# Patient Record
Sex: Female | Born: 1990 | Race: White | Hispanic: No | Smoking: Current every day smoker
Health system: Southern US, Community
[De-identification: ages and names within clinical notes are randomized; demographics above are authoritative.]

## PROBLEM LIST (undated history)

## (undated) DIAGNOSIS — I471 Supraventricular tachycardia, unspecified: Secondary | ICD-10-CM

## (undated) DIAGNOSIS — K5010 Crohn's disease of large intestine without complications: Secondary | ICD-10-CM

## (undated) DIAGNOSIS — E7800 Pure hypercholesterolemia, unspecified: Secondary | ICD-10-CM

---

## 2021-03-23 ENCOUNTER — Encounter (HOSPITAL_BASED_OUTPATIENT_CLINIC_OR_DEPARTMENT_OTHER): Payer: Self-pay

## 2021-03-23 ENCOUNTER — Other Ambulatory Visit: Payer: Self-pay

## 2021-03-23 ENCOUNTER — Emergency Department (HOSPITAL_BASED_OUTPATIENT_CLINIC_OR_DEPARTMENT_OTHER)
Admission: EM | Admit: 2021-03-23 | Discharge: 2021-03-23 | Disposition: A | Discharge status: Home / Self Care | Payer: Medicaid Other | Attending: Emergency Medicine | Admitting: Emergency Medicine

## 2021-03-23 ENCOUNTER — Emergency Department (HOSPITAL_BASED_OUTPATIENT_CLINIC_OR_DEPARTMENT_OTHER): Payer: Medicaid Other

## 2021-03-23 DIAGNOSIS — N9489 Other specified conditions associated with female genital organs and menstrual cycle: Secondary | ICD-10-CM | POA: Diagnosis not present

## 2021-03-23 DIAGNOSIS — O418X10 Other specified disorders of amniotic fluid and membranes, first trimester, not applicable or unspecified: Secondary | ICD-10-CM | POA: Diagnosis not present

## 2021-03-23 DIAGNOSIS — O468X1 Other antepartum hemorrhage, first trimester: Secondary | ICD-10-CM

## 2021-03-23 DIAGNOSIS — Z3A10 10 weeks gestation of pregnancy: Secondary | ICD-10-CM | POA: Insufficient documentation

## 2021-03-23 DIAGNOSIS — O209 Hemorrhage in early pregnancy, unspecified: Secondary | ICD-10-CM | POA: Diagnosis present

## 2021-03-23 DIAGNOSIS — N939 Abnormal uterine and vaginal bleeding, unspecified: Secondary | ICD-10-CM

## 2021-03-23 HISTORY — DX: Pure hypercholesterolemia, unspecified: E78.00

## 2021-03-23 HISTORY — DX: Supraventricular tachycardia, unspecified: I47.10

## 2021-03-23 HISTORY — DX: Supraventricular tachycardia: I47.1

## 2021-03-23 HISTORY — DX: Crohn's disease of large intestine without complications: K50.10

## 2021-03-23 LAB — URINALYSIS, MICROSCOPIC (REFLEX): RBC / HPF: >50 RBC/hpf (ref 0–5)

## 2021-03-23 LAB — CBC WITH DIFFERENTIAL/PLATELET
Abs Immature Granulocytes: 0.01 10*3/uL (ref 0.00–0.07)
Basophils Absolute: 0 10*3/uL (ref 0.0–0.1)
Basophils Relative: 1 %
Eosinophils Absolute: 0.2 10*3/uL (ref 0.0–0.5)
Eosinophils Relative: 3 %
HCT: 40.5 % (ref 36.0–46.0)
Hemoglobin: 14 g/dL (ref 12.0–15.0)
Immature Granulocytes: 0 %
Lymphocytes Relative: 35 %
Lymphs Abs: 2.2 10*3/uL (ref 0.7–4.0)
MCH: 31.4 pg (ref 26.0–34.0)
MCHC: 34.6 g/dL (ref 30.0–36.0)
MCV: 90.8 fL (ref 80.0–100.0)
Monocytes Absolute: 0.6 10*3/uL (ref 0.1–1.0)
Monocytes Relative: 9 %
Neutro Abs: 3.4 10*3/uL (ref 1.7–7.7)
Neutrophils Relative %: 52 %
Platelets: 193 10*3/uL (ref 150–400)
RBC: 4.46 MIL/uL (ref 3.87–5.11)
RDW: 13.2 % (ref 11.5–15.5)
WBC: 6.4 10*3/uL (ref 4.0–10.5)
nRBC: 0 % (ref 0.0–0.2)

## 2021-03-23 LAB — URINALYSIS, ROUTINE W REFLEX MICROSCOPIC
Bilirubin Urine: NEGATIVE
Glucose, UA: NEGATIVE mg/dL
Ketones, ur: NEGATIVE mg/dL
Leukocytes,Ua: NEGATIVE
Nitrite: NEGATIVE
Protein, ur: 100 mg/dL — AB
Specific Gravity, Urine: 1.01 (ref 1.005–1.030)
pH: 6.5 (ref 5.0–8.0)

## 2021-03-23 LAB — BASIC METABOLIC PANEL
Anion gap: 10 (ref 5–15)
BUN: <5 mg/dL — ABNORMAL LOW (ref 6–20)
CO2: 21 mmol/L — ABNORMAL LOW (ref 22–32)
Calcium: 8.9 mg/dL (ref 8.9–10.3)
Chloride: 105 mmol/L (ref 98–111)
Creatinine, Ser: 0.42 mg/dL — ABNORMAL LOW (ref 0.44–1.00)
GFR, Estimated: >60 mL/min (ref >60)
Glucose, Bld: 83 mg/dL (ref 70–99)
Potassium: 3.4 mmol/L — ABNORMAL LOW (ref 3.5–5.1)
Sodium: 136 mmol/L (ref 135–145)

## 2021-03-23 LAB — HCG, QUANTITATIVE, PREGNANCY: hCG, Beta Chain, Quant, S: 14803 m[IU]/mL — ABNORMAL HIGH (ref <5)

## 2021-03-23 LAB — ABO/RH: ABO/RH(D): A POS

## 2021-03-23 MED ORDER — ACETAMINOPHEN 500 MG PO TABS
1000.0000 mg | ORAL_TABLET | Freq: Four times a day (QID) | ORAL | Status: DC | PRN
  Administered 2021-03-23: 1000 mg via ORAL
  Filled 2021-03-23: qty 2

## 2021-03-23 NOTE — Discharge Instructions (Signed)
Return to ED with any new or worsening symptoms such as uncontrolled vaginal bleeding, going through more than 1 pad an hour, abdominal cramping Please follow-up with your OB/GYN on Tuesday as previously discussed Please avoid all sexual activity with your husband until you have discussed your new finding of subchorionic hemorrhaging with your OB/GYN

## 2021-03-23 NOTE — ED Provider Notes (Signed)
[facility] EMERGENCY DEPARTMENT Provider Note   CSN: [id number] Arrival date & time: 03/23/21  1414     History  Chief Complaint  Patient presents with   Vaginal Bleeding    Approximately [redacted] weeks pregnant   Abdominal Cramping    Emily Skinner is a 31 y.o. female G6, P5 who presents to the ED due to vaginal bleeding that began this morning.  The patient is reportedly [redacted] weeks pregnant and receiving prenatal care through [facility] in [place].  Patient states that she called the on-call nurse when she noticed the vaginal bleeding starting and the nurse advised her to report to the ED.  Patient states that for the duration of her pregnancy, she has had light spotting that she characterizes as light pink.  The patient states that her and her husband had intercourse this morning and afterwards she began having heavy vaginal bleeding.  The patient states that the sex was not particularly rough and they did not use any instrumentation.  Patient states that the blood is dark red and darker than the usual spotting she has experienced during the earlier course of her pregnancy.  The patient reports that she is going through 1 pad an hour.  Patient denies lightheadedness, dizziness, weakness, abdominal cramping.    Vaginal Bleeding Associated symptoms: no abdominal pain, no dizziness, no nausea and no vaginal discharge   Abdominal Cramping Pertinent negatives include no abdominal pain.      Home Medications Prior to Admission medications   Not on File      Allergies    Sulfa antibiotics    Review of Systems   Review of Systems  Gastrointestinal:  Negative for abdominal pain, diarrhea, nausea and vomiting.  Genitourinary:  Positive for vaginal bleeding. Negative for vaginal discharge and vaginal pain.  Neurological:  Negative for dizziness, weakness and light-headedness.  All other systems reviewed and are negative.  Physical Exam Updated Vital Signs BP 121/76     Pulse 83    Temp 98.4 F (36.9 C) (Oral)    Resp 16    Ht 5\' 7"  (1.702 m)    Wt 92.1 kg    LMP 01/14/2021    SpO2 98%    BMI 31.79 kg/m  Physical Exam Vitals and nursing note reviewed.  Constitutional:      General: She is not in acute distress.    Appearance: She is not ill-appearing or toxic-appearing.     Comments: Patient appears extremely anxious speaking in pressured tone  HENT:     Head: Normocephalic and atraumatic.     Nose: Nose normal.     Mouth/Throat:     Mouth: Mucous membranes are moist.  Eyes:     Extraocular Movements: Extraocular movements intact.     Pupils: Pupils are equal, round, and reactive to light.  Cardiovascular:     Rate and Rhythm: Normal rate and regular rhythm.  Pulmonary:     Effort: Pulmonary effort is normal.     Breath sounds: No wheezing.  Abdominal:     General: Abdomen is flat. There is no distension.     Palpations: Abdomen is soft.     Tenderness: There is no abdominal tenderness. There is no guarding.  Genitourinary:    General: Normal vulva.     Labia:        Right: No lesion or injury.        Left: No lesion or injury.      Vagina: No vaginal discharge.  Cervix: Cervical bleeding present. No erythema.     Adnexa: Right adnexa normal and left adnexa normal.       Right: No tenderness.         Left: No tenderness.       Comments: Closed cervical os.  Moderate amount of blood noted in the vaginal vault. Musculoskeletal:     Cervical back: Normal range of motion and neck supple. No rigidity or tenderness.  Skin:    General: Skin is warm and dry.     Capillary Refill: Capillary refill takes less than 2 seconds.  Neurological:     General: No focal deficit present.     Mental Status: She is alert and oriented to person, place, and time. Mental status is at baseline.    ED Results / Procedures / Treatments   Labs (all labs ordered are listed, but only abnormal results are displayed) Labs Reviewed  BASIC METABOLIC PANEL -  Abnormal; Notable for the following components:      Result Value   Potassium 3.4 (*)    CO2 21 (*)    BUN <5 (*)    Creatinine, Ser 0.42 (*)    All other components within normal limits  HCG, QUANTITATIVE, PREGNANCY - Abnormal; Notable for the following components:   hCG, Beta Chain, Quant, S 14,803 (*)    All other components within normal limits  CBC WITH DIFFERENTIAL/PLATELET  URINALYSIS, ROUTINE W REFLEX MICROSCOPIC  ABO/RH    EKG None  Radiology US OB Comp Less 14 Wks  Result Date: 03/23/2021 CLINICAL DATA:  Vaginal bleeding for 1 day. First trimester pregnancy. EXAM: OBSTETRIC <14 WK [place] AND TRANSVAGINAL OB US TECHNIQUE: Both transabdominal and transvaginal ultrasound examinations were performed for complete evaluation of the gestation as well as the maternal uterus, adnexal regions, and pelvic cul-de-sac. Transvaginal technique was performed to assess early pregnancy. COMPARISON:  None. FINDINGS: Intrauterine gestational sac: Present Yolk sac:  Absent Embryo:  Present Cardiac Activity: Present Heart Rate: 160 bpm CRL:  52 mm   11 w   6 d                  [place] EDC: 10/16/2021 Subchorionic hemorrhage:  Moderate, measuring 2.2 by 1.8 by 3.3 cm. Maternal uterus/adnexae: No free fluid. The ovaries were not well seen. IMPRESSION: 1. Single living intrauterine pregnancy measuring at 11 weeks, 6 days gestation. There is a moderate amount of subchorionic hemorrhage measuring 2.2 by 1.8 by 3.3 cm. 2. The maternal ovaries were not well seen. Electronically Signed   By: Gaylyn Rong M.D.   On: 03/23/2021 17:42   US OB Transvaginal  Result Date: 03/23/2021 CLINICAL DATA:  Vaginal bleeding for 1 day. First trimester pregnancy. EXAM: OBSTETRIC <14 WK [place] AND TRANSVAGINAL OB US TECHNIQUE: Both transabdominal and transvaginal ultrasound examinations were performed for complete evaluation of the gestation as well as the maternal uterus, adnexal regions, and pelvic cul-de-sac. Transvaginal technique  was performed to assess early pregnancy. COMPARISON:  None. FINDINGS: Intrauterine gestational sac: Present Yolk sac:  Absent Embryo:  Present Cardiac Activity: Present Heart Rate: 160 bpm CRL:  52 mm   11 w   6 d                  [place] EDC: 10/16/2021 Subchorionic hemorrhage:  Moderate, measuring 2.2 by 1.8 by 3.3 cm. Maternal uterus/adnexae: No free fluid. The ovaries were not well seen. IMPRESSION: 1. Single living intrauterine pregnancy measuring at 11 weeks, 6 days gestation.  There is a moderate amount of subchorionic hemorrhage measuring 2.2 by 1.8 by 3.3 cm. 2. The maternal ovaries were not well seen. Electronically Signed   By: Gaylyn RongWalter  Liebkemann M.D.   On: 03/23/2021 17:42    Procedures Pelvic exam  Date/Time: 03/23/2021 5:16 PM Performed by: Al DecantGroce, Jaiyden Laur F, PA-C Authorized by: Al DecantGroce, Mazell Aylesworth F, PA-C  Consent: Verbal consent obtained. Written consent obtained. Consent given by: patient Patient understanding: patient states understanding of the procedure being performed Patient consent: the patient's understanding of the procedure matches consent given Patient identity confirmed: verbally with patient and arm band Time out: Immediately prior to procedure a "time out" was called to verify the correct patient, procedure, equipment, support staff and site/side marked as required. Preparation: Patient was prepped and draped in the usual sterile fashion. Local anesthesia used: no  Anesthesia: Local anesthesia used: no  Sedation: Patient sedated: no  Patient tolerance: patient tolerated the procedure well with no immediate complications Comments: I had a more difficult time than usual visualizing the patient's cervix.  On visual inspection, there is a moderate amount of blood noted at the vaginal vault.  Cervical os appears closed.  This was confirmed during bimanual examination.  There is no adnexal tenderness, cervical motion tenderness.      Medications Ordered in  ED Medications  acetaminophen (TYLENOL) tablet 1,000 mg (1,000 mg Oral Given 03/23/21 1643)    ED Course/ Medical Decision Making/ A&P                           Medical Decision Making Amount and/or Complexity of Data Reviewed Labs: ordered. Radiology: ordered.  Risk OTC drugs.   31 year old female presents due to vaginal bleeding after having sexual intercourse this morning with her husband.  Patient is [redacted] weeks pregnant.  On examination, patient is afebrile, nontachycardic, not hypoxic, speaking full sentences.  Labs and imaging ordered and personally interpreted by me include: Quant hCG, BMP, CBC, ABO Rh, ultrasound OB transvaginal, ultrasound OB transabdominal.  Patient CBC result is in the normal limits.  There are no signs of low hemoglobin. Patient BMP results within normal limits Patient quantitative hCG resulted 14,803. Patient ABO Rh shows the patient is A positive OB transvaginal/transabdominal ultrasound shows single live intrauterine pregnancy measuring 11 weeks.  The estimated cardiac activity/BPM at this intrauterine pregnancy is 160 bpm.  There appears to be subchorionic hemorrhaging measured at 2.2 x 1.8 x 3.3 cm.  This is most likely cause of bleeding.  Subchorionic hemorrhaging is extremely common in 1st trimester, usually gone by third trimester.  Due to this finding, I do not see a need at this time to admit the patient review any further imaging.  I do not see need to admit this patient as her vital signs of been stable for the duration of her visit and her lab work is reassuring.  The patient states that she has good follow-up with her OB/GYN and is scheduled to have an ultrasound on Tuesday.  I have explained to the findings of this patient and she voices understanding and joy.  I have advised the patient to maintain her scheduled appointment with her OB/GYN for Tuesday.  I have provided the patient work with return precautions and she voices understanding.  All of  the patient's questions were answered to her satisfaction.  I have discussed this patient and her case with Dr. Wallace CullensGray who is in agreement with the plan.  This patient is stable  on discharge.    Final Clinical Impression(s) / ED Diagnoses Final diagnoses:  Subchorionic hemorrhage of placenta in first trimester, single or unspecified fetus    Rx / DC Orders ED Discharge Orders     None         Al Decant, PA-C 03/23/21 1814    Franne Forts, DO 03/26/21 1157

## 2021-03-23 NOTE — ED Triage Notes (Signed)
Pt reports she is about 10-[redacted] weeks pregnant. She reports vaginal bleeding and abdominal cramping that started today.

## 2021-03-23 NOTE — ED Notes (Addendum)
Pt's blood type A+ per [facility] labwork from 06/29/2018 in Care Everywhere. Provider made aware

## 2021-08-06 ENCOUNTER — Inpatient Hospital Stay (HOSPITAL_COMMUNITY)
Admission: AD | Admit: 2021-08-06 | Discharge: 2021-08-06 | Disposition: A | Discharge status: Home / Self Care | Payer: Medicaid Other | Attending: Obstetrics and Gynecology | Admitting: Obstetrics and Gynecology

## 2021-08-06 ENCOUNTER — Encounter (HOSPITAL_COMMUNITY): Payer: Self-pay | Admitting: Obstetrics and Gynecology

## 2021-08-06 DIAGNOSIS — K625 Hemorrhage of anus and rectum: Secondary | ICD-10-CM | POA: Diagnosis present

## 2021-08-06 DIAGNOSIS — O26893 Other specified pregnancy related conditions, third trimester: Secondary | ICD-10-CM | POA: Insufficient documentation

## 2021-08-06 DIAGNOSIS — Z3A31 31 weeks gestation of pregnancy: Secondary | ICD-10-CM | POA: Insufficient documentation

## 2021-08-06 NOTE — MAU Provider Note (Signed)
History     CSN: [id number]  Arrival date and time: 08/06/21 1640   Event Date/Time   First Provider Initiated Contact with Patient 08/06/21 1709      Chief Complaint  Patient presents with   Dizziness   Rectal Bleeding   Emily Skinner is a 30 y.o. [id number] at [redacted]w[redacted]d who presents today with what she believes is rectal bleeding. 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 She also reports that on 07/06/2021 her children were diagnosed with E.Coli so she was started on Augmentin since that is what her children's stool culture showed was effective. She never had symptoms that she could tell. She reports that she has constant diarrhea, so she was given the medication due to eating and drinking the same things as her kids as well as being in close contact with them. She did take all the medication as she was prescribed.   Patient here with rectal bleeding that started earlier today .She states that at times it has been very heavy and she has had a few small blood clots. She sees Dr. Baldo Ash in [facility] for her chron's disease. She has not been to see them since November or December 2022. She had a colonoscopy at the end of 2022. She stopped her medication for Chron's because she can't take it in her 3rd trimester. She also missed some doses due to a sinus infection and she couldn't get the medication while she was sick. They didn't have any plan for her to take anything during her 3rd trimester.   Patient denies any contractions, VB or  LOF. She reports normal fetal movement.   Rectal Bleeding    OB History     Gravida  7   Para  5   Term  5   Preterm      AB  1   Living  4      SAB  1   IAB      Ectopic      Multiple      Live Births              Past Medical History:  Diagnosis Date   Crohn's colitis (HCC)    High cholesterol    PSVT (paroxysmal supraventricular tachycardia) (HCC)       No family  history on file.  Social History   Tobacco Use   Smoking status: Every Day    Types: Cigarettes   Smokeless tobacco: Never  Substance Use Topics   Alcohol use: Not Currently   Drug use: Never    Allergies:  Allergies  Allergen Reactions   Sulfa Antibiotics Itching    Medications Prior to Admission  Medication Sig Dispense Refill Last Dose   aspirin EC 81 MG tablet Take 81 mg by mouth daily. Swallow whole.   08/06/2021   hydrocortisone (ANUSOL-HC) 2.5 % rectal cream Place 1 application. rectally 2 (two) times daily.   t-2   inFLIXimab in sodium chloride 0.9 % Inject into the vein.   03/05/2021   metoprolol tartrate (LOPRESSOR) 25 MG tablet Take 25 mg by mouth 2 (two) times daily.   08/06/2021   nystatin ointment (MYCOSTATIN) Apply 1 application. topically 2 (two) times daily.   08/05/2021   Prenatal Vit-Fe Fumarate-FA (PRENATAL MULTIVITAMIN) TABS tablet Take 1 tablet by mouth daily at 12 noon.   08/06/2021    Review of Systems  All other systems reviewed and are  negative. Physical Exam   Blood pressure 116/76, pulse 94, temperature 98.3 F (36.8 C), temperature source Oral, resp. rate 16, last menstrual period 01/14/2021, SpO2 98 %.  Physical Exam Constitutional:      Appearance: She is well-developed.  HENT:     Head: Normocephalic.  Eyes:     Pupils: Pupils are equal, round, and reactive to light.  Cardiovascular:     Rate and Rhythm: Normal rate and regular rhythm.     Heart sounds: Normal heart sounds.  Pulmonary:     Effort: Pulmonary effort is normal. No respiratory distress.     Breath sounds: Normal breath sounds.  Abdominal:     Palpations: Abdomen is soft.     Tenderness: There is no abdominal tenderness.  Genitourinary:    Vagina: No bleeding. Vaginal discharge: mucusy.    Comments: External: no lesion Vagina: small amount of white discharge No bleeding noted at all from the vagina or cervix on speculum exam.  Some skin tags/small hemorrhoids around the anus.       Musculoskeletal:        General: Normal range of motion.     Cervical back: Normal range of motion and neck supple.  Skin:    General: Skin is warm and dry.  Neurological:     Mental Status: She is alert and oriented to person, place, and time.  Psychiatric:        Mood and Affect: Mood normal.        Behavior: Behavior normal.     NST:  Baseline: 155 Variability: moderate Accels: 15x15 Decels: none Toco: none Reactive/Appropriate for GA   MAU Course  Procedures  MDM 5:53PM: DW Dr. Ilda Basset and reviewed HPI/physical exam. No blood in vagina or noted on pelvic exam so this appears to be rectal bleeding. Since everything is ok from an OB standpoint, patient is ok for DC home and should follow up with her GI ASAP.   Assessment and Plan   1. Bright red rectal bleeding   2. [redacted] weeks gestation of pregnancy    DC home in stable condition  Comfort measures reviewed  3rd Trimester precautions  Bleeding precautions PTL precautions  Fetal kick counts RX: none  Return to MAU as needed FU with OB as planned   Follow-up Information     Akdamar, Murvin Donning, MD Follow up.   Specialty: Specialist Why: Call them ASAP to discuss symptoms Contact information: [address] [id number] [place] DNP, [place]  08/06/21  6:32 PM

## 2021-08-06 NOTE — MAU Note (Signed)
...  Emily Skinner is a 31 y.o. at [redacted]w[redacted]d here in MAU reporting: Around 20 minutes ago she used the restroom and noted bright red blood in the toilet and noted a few blood clots. She reports she thinks it came from her bottom as she has severe Chrohns Disease and has chronic hemorrhoids. She reports she took a tissue and wiped into her vagina and did not see any blood on that tissue. She is also endorsing light headedness that has been occasional since waking up this morning. She reports she thinks it is because her two year old daughter would not sleep last night. She is also endorsing generalized abdominal pain that has been occurring for "years" and she is unsure of how to differentiate what is new pain for pregnancy verses Chrohn's. Denies LOF. +FM.   She reports her children were diagnosed with E. Coli a couple of weeks ago and her MD prescribed her an antibiotic for coverage.  She reports she has not had any treatment for her Chrohns disease since January.   Pain score: 10/10 generalized abdomen  FHT: 155 initial FHR Lab orders placed from triage: UA

## 2021-11-25 ENCOUNTER — Encounter (HOSPITAL_COMMUNITY): Payer: Self-pay | Admitting: Emergency Medicine

## 2021-11-25 ENCOUNTER — Other Ambulatory Visit: Payer: Self-pay

## 2021-11-25 ENCOUNTER — Emergency Department (HOSPITAL_COMMUNITY)
Admission: EM | Admit: 2021-11-25 | Discharge: 2021-11-26 | Discharge status: Against Medical Advice | Payer: Medicaid Other | Attending: Emergency Medicine | Admitting: Emergency Medicine

## 2021-11-25 DIAGNOSIS — Z5321 Procedure and treatment not carried out due to patient leaving prior to being seen by health care provider: Secondary | ICD-10-CM | POA: Diagnosis not present

## 2021-11-25 DIAGNOSIS — R0789 Other chest pain: Secondary | ICD-10-CM | POA: Diagnosis not present

## 2021-11-25 LAB — BASIC METABOLIC PANEL
Anion gap: 8 (ref 5–15)
BUN: 13 mg/dL (ref 6–20)
CO2: 23 mmol/L (ref 22–32)
Calcium: 9.5 mg/dL (ref 8.9–10.3)
Chloride: 108 mmol/L (ref 98–111)
Creatinine, Ser: 0.62 mg/dL (ref 0.44–1.00)
GFR, Estimated: >60 mL/min (ref >60)
Glucose, Bld: 110 mg/dL — ABNORMAL HIGH (ref 70–99)
Potassium: 3.4 mmol/L — ABNORMAL LOW (ref 3.5–5.1)
Sodium: 139 mmol/L (ref 135–145)

## 2021-11-25 LAB — CBC WITH DIFFERENTIAL/PLATELET
Abs Immature Granulocytes: 0.04 10*3/uL (ref 0.00–0.07)
Basophils Absolute: 0 10*3/uL (ref 0.0–0.1)
Basophils Relative: 0 %
Eosinophils Absolute: 0.3 10*3/uL (ref 0.0–0.5)
Eosinophils Relative: 3 %
HCT: 40.9 % (ref 36.0–46.0)
Hemoglobin: 13.4 g/dL (ref 12.0–15.0)
Immature Granulocytes: 0 %
Lymphocytes Relative: 35 %
Lymphs Abs: 3.4 10*3/uL (ref 0.7–4.0)
MCH: 30.7 pg (ref 26.0–34.0)
MCHC: 32.8 g/dL (ref 30.0–36.0)
MCV: 93.8 fL (ref 80.0–100.0)
Monocytes Absolute: 0.7 10*3/uL (ref 0.1–1.0)
Monocytes Relative: 7 %
Neutro Abs: 5.3 10*3/uL (ref 1.7–7.7)
Neutrophils Relative %: 55 %
Platelets: 277 10*3/uL (ref 150–400)
RBC: 4.36 MIL/uL (ref 3.87–5.11)
RDW: 13.2 % (ref 11.5–15.5)
WBC: 9.7 10*3/uL (ref 4.0–10.5)
nRBC: 0 % (ref 0.0–0.2)

## 2021-11-25 LAB — LIPASE, BLOOD: Lipase: 62 U/L — ABNORMAL HIGH (ref 11–51)

## 2021-11-25 LAB — I-STAT BETA HCG BLOOD, ED (MC, WL, AP ONLY): I-stat hCG, quantitative: <5 m[IU]/mL (ref <5)

## 2021-11-25 LAB — TROPONIN I (HIGH SENSITIVITY): Troponin I (High Sensitivity): 2 ng/L (ref <18)

## 2021-11-25 NOTE — ED Triage Notes (Addendum)
Pt c/o sharp left sided chest pain under left breast x 20 mins with radiation to left flank and left back; pt reports same "episodes" intermittent x 1 year but today pain has lasted longer; worse with inhalation and bending over; pt reports having a C-Section x 1 month ago

## 2021-11-25 NOTE — ED Provider Triage Note (Signed)
Emergency Medicine Provider Triage Evaluation Note  Emily Skinner , a 31 y.o. female  was evaluated in triage.  Pt complains of CP. No LE swelling or pain. CP constant over the last 20 min, does have intermittent CP at baseline. Has SVT. Denies palpitations. C-section 8 weeks ago. No hx of PE, DVT. Occasionally has ABD pain. Hx of crohn's not on meds. No bloody stool  Review of Systems  Positive: CP Negative:   Physical Exam  BP 126/83 (BP Location: Right Arm)   Pulse 74   Temp 97.9 F (36.6 C)   Resp 18   Ht 5\' 7"  (1.702 m)   Wt 81.6 kg   LMP 11/22/2021 (Exact Date)   SpO2 97%   Breastfeeding Unknown   BMI 28.19 kg/m  Gen:   Awake, no distress   Resp:  Normal effort  MSK:   Moves extremities without difficulty  Other:    Medical Decision Making  Medically screening exam initiated at 7:20 PM.  Appropriate orders placed.  Arin Vanosdol was informed that the remainder of the evaluation will be completed by another provider, this initial triage assessment does not replace that evaluation, and the importance of remaining in the ED until their evaluation is complete.  CP   Karter Hellmer A, PA-C 11/25/21 1922

## 2021-11-26 NOTE — ED Notes (Signed)
Pt left AMA °

## 2022-01-22 ENCOUNTER — Emergency Department (HOSPITAL_COMMUNITY)
Admission: EM | Admit: 2022-01-22 | Discharge: 2022-01-22 | Disposition: A | Discharge status: Home / Self Care | Payer: Medicaid Other | Attending: Emergency Medicine | Admitting: Emergency Medicine

## 2022-01-22 ENCOUNTER — Encounter (HOSPITAL_COMMUNITY): Payer: Self-pay | Admitting: Emergency Medicine

## 2022-01-22 ENCOUNTER — Other Ambulatory Visit: Payer: Self-pay

## 2022-01-22 DIAGNOSIS — H10211 Acute toxic conjunctivitis, right eye: Secondary | ICD-10-CM | POA: Insufficient documentation

## 2022-01-22 DIAGNOSIS — Z7982 Long term (current) use of aspirin: Secondary | ICD-10-CM | POA: Diagnosis not present

## 2022-01-22 DIAGNOSIS — H5789 Other specified disorders of eye and adnexa: Secondary | ICD-10-CM | POA: Diagnosis present

## 2022-01-22 DIAGNOSIS — T6591XA Toxic effect of unspecified substance, accidental (unintentional), initial encounter: Secondary | ICD-10-CM | POA: Diagnosis not present

## 2022-01-22 MED ORDER — TETRACAINE HCL 0.5 % OP SOLN
2.0000 [drp] | Freq: Once | OPHTHALMIC | Status: AC
  Administered 2022-01-22: 2 [drp] via OPHTHALMIC

## 2022-01-22 MED ORDER — IBUPROFEN 200 MG PO TABS
600.0000 mg | ORAL_TABLET | Freq: Once | ORAL | Status: AC
  Administered 2022-01-22: 600 mg via ORAL
  Filled 2022-01-22: qty 3

## 2022-01-22 MED ORDER — TETRACAINE HCL 0.5 % OP SOLN
2.0000 [drp] | Freq: Once | OPHTHALMIC | Status: AC
  Administered 2022-01-22: 2 [drp] via OPHTHALMIC
  Filled 2022-01-22: qty 4

## 2022-01-22 MED ORDER — FLUORESCEIN SODIUM 1 MG OP STRP
1.0000 | ORAL_STRIP | Freq: Once | OPHTHALMIC | Status: AC
  Administered 2022-01-22: 1 via OPHTHALMIC
  Filled 2022-01-22: qty 1

## 2022-01-22 NOTE — ED Provider Notes (Signed)
[facility]-EMERGENCY DEPT Provider Note   CSN: [id number] Arrival date & time: 01/22/22  1750     History  Chief Complaint  Patient presents with   Eye Injury    Emily Skinner is a 31 y.o. female.   Eye Injury  Patient presents after eye injury.  Had a dish pot exploded and dry this morning.  Has some swelling.  Has been using cold compresses.  Went to urgent care who later sent her in for treatment here.  Mild blurred vision.  Does have pain.  Reportedly had a "tear" in her eye.    Past Medical History:  Diagnosis Date   Crohn's colitis (HCC)    High cholesterol    PSVT (paroxysmal supraventricular tachycardia)     Home Medications Prior to Admission medications   Medication Sig Start Date End Date Taking? Authorizing Provider  aspirin EC 81 MG tablet Take 81 mg by mouth daily. Swallow whole.    [provider]  hydrocortisone (ANUSOL-HC) 2.5 % rectal cream Place 1 application. rectally 2 (two) times daily.    [provider]  inFLIXimab in sodium chloride 0.9 % Inject into the vein.    [provider]  metoprolol tartrate (LOPRESSOR) 25 MG tablet Take 25 mg by mouth 2 (two) times daily.    [provider]  nystatin ointment (MYCOSTATIN) Apply 1 application. topically 2 (two) times daily.    [provider]  Prenatal Vit-Fe Fumarate-FA (PRENATAL MULTIVITAMIN) TABS tablet Take 1 tablet by mouth daily at 12 noon.    [provider]      Allergies    Sulfa antibiotics    Review of Systems   Review of Systems  Physical Exam Updated Vital Signs BP 109/75 (BP Location: Left Arm)   Pulse (!) 58   Temp 98.1 F (36.7 C) (Oral)   Resp 18   Ht 5\' 7"  (1.702 m)   Wt 82 kg   SpO2 100%   BMI 28.31 kg/m  Physical Exam Vitals and nursing note reviewed.  Constitutional:      Appearance: Normal appearance.  HENT:     Head: Atraumatic.  Eyes:     Pupils: Pupils are equal, round, and reactive  to light.   Conjunctival injection on right.  Some edema around face.  Does not appear to be cellulitic.  No corneal uptake on slit-lamp exam.  Normal pH.  ED Results / Procedures / Treatments   Labs (all labs ordered are listed, but only abnormal results are displayed) Labs Reviewed - No data to display  EKG None  Radiology No results found.  Procedures Procedures    Medications Ordered in ED Medications  tetracaine (PONTOCAINE) 0.5 % ophthalmic solution 2 drop (2 drops Right Eye Given 01/22/22 1842)  ibuprofen (ADVIL) tablet 600 mg (600 mg Oral Given 01/22/22 1845)  tetracaine (PONTOCAINE) 0.5 % ophthalmic solution 2 drop (2 drops Right Eye Given by Other 01/22/22 2133)  fluorescein ophthalmic strip 1 strip (1 strip Right Eye Given by Other 01/22/22 2133)    ED Course/ Medical Decision Making/ A&P                           Medical Decision Making Risk Prescription drug management.   Patient with chemical injury to right eye.  Was a dishwasher pod.  Does have some irritation but no corneal uptake.  pH normal.  Irritated eye but also more irrigation.  With benign exam  I think can follow-up with ophthalmology as needed.  Already given erythromycin by urgent care.  Will discharge home.        Final Clinical Impression(s) / ED Diagnoses Final diagnoses:  Chemical conjunctivitis of right eye    Rx / DC Orders ED Discharge Orders     None         Benjiman Core, MD 01/22/22 2329

## 2022-01-22 NOTE — ED Notes (Signed)
R Eye irrigated with normal saline, pt tolerated well.

## 2022-01-22 NOTE — ED Notes (Signed)
Eye materials at bedside

## 2022-01-22 NOTE — Discharge Instructions (Signed)
Continue the since you are given by the urgent care.

## 2022-01-22 NOTE — ED Triage Notes (Signed)
Patient report eye injury this morning. Pt report she tried to separate two dish pods and pods burst and shoot all over on her right eye. Urgent care tried to irrigate her eye but urgent care recommended she need to come in ED  d/t eye drainage and small laceration on her eye. Pt c/o shooting, burning pain on her left eye.

## 2022-01-22 NOTE — ED Provider Triage Note (Signed)
Emergency Medicine Provider Triage Evaluation Note  Lindzie Boxx , a 31 y.o. female  was evaluated in triage.  Pt complains of eye injury. Patient was attempting to separate dish washing pods this morning, when pod "exploded", with product getting into right eye. She immediately irrigated with water for 15 minutes, followed by cool compresses. She continued to have pain and blurred vision, and was seen by urgent care. Patient reports provider saw a laceration to the eye, and patient was sent to the ED for additional care.  Review of Systems  Positive: Blurred vision, eye drainage, eye irritation,headache Negative: Nausea, vomiting  Physical Exam  BP (!) 159/84   Pulse 98   Temp 98 F (36.7 C) (Oral)   Resp 18   Ht 5\' 7"  (1.702 m)   Wt 82 kg   SpO2 100%   BMI 28.31 kg/m  Gen:   Awake, no distress   Resp:  Normal effort  MSK:   Moves extremities without difficulty  Other:  Edema of eyelid, conjunctival injection, with mucous discharge  Medical Decision Making  Medically screening exam initiated at 6:25 PM.  Appropriate orders placed.  Breniyah Romm was informed that the remainder of the evaluation will be completed by another provider, this initial triage assessment does not replace that evaluation, and the importance of remaining in the ED until their evaluation is complete.  Tetracaine applied to right eye and ibuprofen given in triage.   Regis Bill, NP 01/22/22 2203

## 2022-05-14 ENCOUNTER — Encounter (HOSPITAL_COMMUNITY): Payer: Self-pay | Admitting: Emergency Medicine

## 2022-05-14 ENCOUNTER — Other Ambulatory Visit: Payer: Self-pay

## 2022-05-14 ENCOUNTER — Emergency Department (HOSPITAL_COMMUNITY)
Admission: EM | Admit: 2022-05-14 | Discharge: 2022-05-15 | Disposition: A | Discharge status: Home / Self Care | Payer: Medicaid Other | Attending: Emergency Medicine | Admitting: Emergency Medicine

## 2022-05-14 DIAGNOSIS — Z7982 Long term (current) use of aspirin: Secondary | ICD-10-CM | POA: Diagnosis not present

## 2022-05-14 DIAGNOSIS — N200 Calculus of kidney: Secondary | ICD-10-CM | POA: Diagnosis not present

## 2022-05-14 DIAGNOSIS — E876 Hypokalemia: Secondary | ICD-10-CM | POA: Diagnosis not present

## 2022-05-14 DIAGNOSIS — N3001 Acute cystitis with hematuria: Secondary | ICD-10-CM | POA: Diagnosis not present

## 2022-05-14 DIAGNOSIS — R739 Hyperglycemia, unspecified: Secondary | ICD-10-CM | POA: Diagnosis not present

## 2022-05-14 DIAGNOSIS — R350 Frequency of micturition: Secondary | ICD-10-CM | POA: Diagnosis present

## 2022-05-14 LAB — COMPREHENSIVE METABOLIC PANEL
ALT: 17 U/L (ref 0–44)
AST: 18 U/L (ref 15–41)
Albumin: 3.8 g/dL (ref 3.5–5.0)
Alkaline Phosphatase: 82 U/L (ref 38–126)
Anion gap: 11 (ref 5–15)
BUN: 7 mg/dL (ref 6–20)
CO2: 24 mmol/L (ref 22–32)
Calcium: 9.1 mg/dL (ref 8.9–10.3)
Chloride: 102 mmol/L (ref 98–111)
Creatinine, Ser: 0.69 mg/dL (ref 0.44–1.00)
GFR, Estimated: >60 mL/min (ref >60)
Glucose, Bld: 175 mg/dL — ABNORMAL HIGH (ref 70–99)
Potassium: 3.3 mmol/L — ABNORMAL LOW (ref 3.5–5.1)
Sodium: 137 mmol/L (ref 135–145)
Total Bilirubin: 0.2 mg/dL — ABNORMAL LOW (ref 0.3–1.2)
Total Protein: 6.5 g/dL (ref 6.5–8.1)

## 2022-05-14 LAB — CBC WITH DIFFERENTIAL/PLATELET
Abs Immature Granulocytes: 0.02 10*3/uL (ref 0.00–0.07)
Basophils Absolute: 0 10*3/uL (ref 0.0–0.1)
Basophils Relative: 0 %
Eosinophils Absolute: 0.3 10*3/uL (ref 0.0–0.5)
Eosinophils Relative: 3 %
HCT: 37.1 % (ref 36.0–46.0)
Hemoglobin: 12.7 g/dL (ref 12.0–15.0)
Immature Granulocytes: 0 %
Lymphocytes Relative: 33 %
Lymphs Abs: 2.8 10*3/uL (ref 0.7–4.0)
MCH: 31.7 pg (ref 26.0–34.0)
MCHC: 34.2 g/dL (ref 30.0–36.0)
MCV: 92.5 fL (ref 80.0–100.0)
Monocytes Absolute: 0.5 10*3/uL (ref 0.1–1.0)
Monocytes Relative: 6 %
Neutro Abs: 4.9 10*3/uL (ref 1.7–7.7)
Neutrophils Relative %: 58 %
Platelets: 264 10*3/uL (ref 150–400)
RBC: 4.01 MIL/uL (ref 3.87–5.11)
RDW: 13.7 % (ref 11.5–15.5)
WBC: 8.4 10*3/uL (ref 4.0–10.5)
nRBC: 0 % (ref 0.0–0.2)

## 2022-05-14 NOTE — ED Provider Triage Note (Signed)
Emergency Medicine Provider Triage Evaluation Note  Emily Skinner , a 32 y.o. female  was evaluated in triage.  Pt complains of urinary frequency x a couple of days but worsen last night. Hematuria and concern due to ongoing pelvic pressure. Has had back pain as well but now is localized to the right flank. Has not taken any meds for improvement in symptoms.  Review of Systems  Positive: Urinary frequency, hematuria Negative: Fever   Physical Exam  BP 137/83 (BP Location: Right Arm)   Pulse 81   Temp 98.5 F (36.9 C) (Oral)   Resp 19   SpO2 97%  Gen:   Awake, no distress   Resp:  Normal effort  MSK:   Moves extremities without difficulty  Other:    Medical Decision Making  Medically screening exam initiated at 5:39 PM.  Appropriate orders placed.  Emily Skinner was informed that the remainder of the evaluation will be completed by another provider, this initial triage assessment does not replace that evaluation, and the importance of remaining in the ED until their evaluation is complete.     Emily Fitting, PA-C 05/14/22 1743

## 2022-05-14 NOTE — ED Triage Notes (Signed)
Pt c/o urinary symptoms for the past day including pain with urination and increase in frequency. Pt also c/o ongoing right sided back pain that has increased today. Pt also states she has noticed blood in her urine yesterday. She states she took 1 antibiotic pill that was left over from c-section without relief.

## 2022-05-15 ENCOUNTER — Emergency Department (HOSPITAL_COMMUNITY): Payer: Medicaid Other

## 2022-05-15 LAB — URINALYSIS, ROUTINE W REFLEX MICROSCOPIC
Bilirubin Urine: NEGATIVE
Glucose, UA: NEGATIVE mg/dL
Ketones, ur: NEGATIVE mg/dL
Nitrite: NEGATIVE
Protein, ur: 30 mg/dL — AB
Specific Gravity, Urine: 1.02 (ref 1.005–1.030)
pH: 6 (ref 5.0–8.0)

## 2022-05-15 LAB — PREGNANCY, URINE: Preg Test, Ur: NEGATIVE

## 2022-05-15 MED ORDER — CEPHALEXIN 500 MG PO CAPS: 500.0000 mg | ORAL_CAPSULE | Freq: Two times a day (BID) | ORAL | 0 refills | Status: AC

## 2022-05-15 MED ORDER — CEPHALEXIN 250 MG PO CAPS
500.0000 mg | ORAL_CAPSULE | Freq: Once | ORAL | Status: AC
  Administered 2022-05-15: 500 mg via ORAL
  Filled 2022-05-15: qty 2

## 2022-05-15 NOTE — ED Provider Notes (Signed)
[facility] Provider Note   CSN: [id number] Arrival date & time: 05/14/22  1653     History  Chief Complaint  Patient presents with   Urinary Frequency    Emily Skinner is a 32 y.o. female.  32 year old female presents with complaint of urinary frequency for the past few days now with hematuria and pelvic pressure.  No history of prior kidney stones.  Denies fevers, chills, nausea, vomiting.       Home Medications Prior to Admission medications   Medication Sig Start Date End Date Taking? Authorizing Provider  cephALEXin (KEFLEX) 500 MG capsule Take 1 capsule (500 mg total) by mouth 2 (two) times daily for 5 days. 05/15/22 05/20/22 Yes Tacy Learn, PA-C  aspirin EC 81 MG tablet Take 81 mg by mouth daily. Swallow whole.    [provider]  hydrocortisone (ANUSOL-HC) 2.5 % rectal cream Place 1 application. rectally 2 (two) times daily.    [provider]  inFLIXimab in sodium chloride 0.9 % Inject into the vein.    [provider]  metoprolol tartrate (LOPRESSOR) 25 MG tablet Take 25 mg by mouth 2 (two) times daily.    [provider]  nystatin ointment (MYCOSTATIN) Apply 1 application. topically 2 (two) times daily.    [provider]  Prenatal Vit-Fe Fumarate-FA (PRENATAL MULTIVITAMIN) TABS tablet Take 1 tablet by mouth daily at 12 noon.    [provider]      Allergies    Sulfa antibiotics    Review of Systems   Review of Systems Negative except as per HPI Physical Exam Updated Vital Signs BP 112/73 (BP Location: Right Arm)   Pulse 66   Temp 97.7 F (36.5 C)   Resp 16   Ht '5\' 7"'$  (1.702 m)   Wt 77.1 kg   SpO2 98%   BMI 26.63 kg/m  Physical Exam Vitals and nursing note reviewed.  Constitutional:      General: She is not in acute distress.    Appearance: She is well-developed. She is not diaphoretic.  HENT:     Head: Normocephalic and atraumatic.   Cardiovascular:     Rate and Rhythm: Normal rate and regular rhythm.     Heart sounds: Normal heart sounds.  Pulmonary:     Effort: Pulmonary effort is normal.     Breath sounds: Normal breath sounds.  Abdominal:     Palpations: Abdomen is soft.     Tenderness: There is no abdominal tenderness. There is no right CVA tenderness or left CVA tenderness.  Skin:    General: Skin is warm and dry.     Findings: No erythema or rash.  Neurological:     Mental Status: She is alert and oriented to person, place, and time.  Psychiatric:        Behavior: Behavior normal.     ED Results / Procedures / Treatments   Labs (all labs ordered are listed, but only abnormal results are displayed) Labs Reviewed  URINALYSIS, ROUTINE W REFLEX MICROSCOPIC - Abnormal; Notable for the following components:      Result Value   APPearance CLOUDY (*)    Hgb urine dipstick LARGE (*)    Protein, ur 30 (*)    Leukocytes,Ua SMALL (*)    Bacteria, UA RARE (*)    All other components within normal limits  COMPREHENSIVE METABOLIC PANEL - Abnormal; Notable for the following components:   Potassium 3.3 (*)  Glucose, Bld 175 (*)    Total Bilirubin 0.2 (*)    All other components within normal limits  CBC WITH DIFFERENTIAL/PLATELET  PREGNANCY, URINE    EKG None  Radiology CT Renal Stone Study  Result Date: 05/15/2022 CLINICAL DATA:  Abdominal/flank pain. EXAM: CT ABDOMEN AND PELVIS WITHOUT CONTRAST TECHNIQUE: Multidetector CT imaging of the abdomen and pelvis was performed following the standard protocol without IV contrast. RADIATION DOSE REDUCTION: This exam was performed according to the departmental dose-optimization program which includes automated exposure control, adjustment of the mA and/or kV according to patient size and/or use of iterative reconstruction technique. COMPARISON:  None Available. FINDINGS: Lower chest: No acute abnormality. Hepatobiliary: No focal liver abnormality is seen. No  gallstones, gallbladder wall thickening, or biliary dilatation. Pancreas: Unremarkable. No pancreatic ductal dilatation or surrounding inflammatory changes. Spleen: Normal in size without focal abnormality. Adrenals/Urinary Tract: Normal adrenal glands. Small bilateral punctate renal calculi are noted which are seen in the upper and lower pole of the left kidney (approximately 3) and inferior pole of the right kidney (approximately 2). No mass or hydronephrosis. No hydroureter or ureteral lithiasis. Urinary bladder appears within normal limits. Stomach/Bowel: Stomach appears normal. The appendix is visualized and is within normal limits. No bowel wall thickening, inflammation, or distension. Vascular/Lymphatic: No significant vascular findings are present. No enlarged abdominal or pelvic lymph nodes. Reproductive: Uterus and bilateral adnexa are unremarkable. Other: No free fluid or fluid collections. No signs of pneumoperitoneum. Musculoskeletal: No acute or significant osseous findings. IMPRESSION: 1. No acute findings within the abdomen or pelvis. 2. Bilateral punctate renal calculi. Electronically Signed   By: Kerby Moors M.D.   On: 05/15/2022 05:32    Procedures Procedures    Medications Ordered in ED Medications  cephALEXin (KEFLEX) capsule 500 mg (has no administration in time range)    ED Course/ Medical Decision Making/ A&P                             Medical Decision Making Amount and/or Complexity of Data Reviewed Labs: ordered. Radiology: ordered.   This patient presents to the ED for concern of right flank pain with urinary urgency and pressure, hematuria, this involves an extensive number of treatment options, and is a complaint that carries with it a high risk of complications and morbidity.  The differential diagnosis includes but not limited to kidney stone, cystitis    Co morbidities that complicate the patient evaluation  Crohn's, hyperlipidemia, PSVT   Additional  history obtained:  External records from outside source obtained and reviewed including prior labs on file for comparison, no prior CT scans for review   Lab Tests:  I Ordered, and personally interpreted labs.  The pertinent results include: CBC within normal limits.  CMP with mild hypokalemia with potassium 3.3, glucose elevated at 175 nonfasting.  hCG negative.  Urinalysis with large amount of blood, protein, small leukocytes, contaminated sample.   Imaging Studies ordered:  I ordered imaging studies including CT stone study I independently visualized and interpreted imaging which showed punctate intrarenal stones without ureteral lithiasis I agree with the radiologist interpretation   Problem List / ED Course / Critical interventions / Medication management  32 yo female with hematuria, right flank pain. UA contaminated, possible UTI. CT negative for ureteral stone. Treated with Keflex. Recommend recheck with PCP to ensure infection/blood resolves.  I ordered medication including keflex  for cystitis   Reevaluation of the patient after  these medicines showed that the patient stayed the same I have reviewed the patients home medicines and have made adjustments as needed   Social Determinants of Health:  No PCP on file   Test / Admission - Considered:  Stable for dc for PCP recheck with return to ER precautions.          Final Clinical Impression(s) / ED Diagnoses Final diagnoses:  Acute cystitis with hematuria  Nephrolithiasis    Rx / DC Orders ED Discharge Orders          Ordered    cephALEXin (KEFLEX) 500 MG capsule  2 times daily        05/15/22 0540              Tacy Learn, PA-C 05/15/22 0551    Fatima Blank, MD 05/15/22 [phone]

## 2022-05-15 NOTE — Discharge Instructions (Addendum)
Recheck with your primary care provider to ensure her blood and urine and infection have cleared.  Return to ER for worsening or concerning symptoms.

## 2022-06-20 ENCOUNTER — Emergency Department (HOSPITAL_BASED_OUTPATIENT_CLINIC_OR_DEPARTMENT_OTHER): Payer: Medicaid Other

## 2022-06-20 ENCOUNTER — Other Ambulatory Visit: Payer: Self-pay

## 2022-06-20 ENCOUNTER — Encounter (HOSPITAL_BASED_OUTPATIENT_CLINIC_OR_DEPARTMENT_OTHER): Payer: Self-pay | Admitting: Emergency Medicine

## 2022-06-20 ENCOUNTER — Emergency Department (HOSPITAL_BASED_OUTPATIENT_CLINIC_OR_DEPARTMENT_OTHER)
Admission: EM | Admit: 2022-06-20 | Discharge: 2022-06-20 | Disposition: A | Discharge status: Home / Self Care | Payer: Medicaid Other | Attending: Emergency Medicine | Admitting: Emergency Medicine

## 2022-06-20 DIAGNOSIS — Z7982 Long term (current) use of aspirin: Secondary | ICD-10-CM | POA: Diagnosis not present

## 2022-06-20 DIAGNOSIS — R519 Headache, unspecified: Secondary | ICD-10-CM | POA: Diagnosis present

## 2022-06-20 DIAGNOSIS — G43909 Migraine, unspecified, not intractable, without status migrainosus: Secondary | ICD-10-CM | POA: Insufficient documentation

## 2022-06-20 DIAGNOSIS — R739 Hyperglycemia, unspecified: Secondary | ICD-10-CM | POA: Diagnosis not present

## 2022-06-20 LAB — COMPREHENSIVE METABOLIC PANEL
ALT: 15 U/L (ref 0–44)
AST: 16 U/L (ref 15–41)
Albumin: 3.8 g/dL (ref 3.5–5.0)
Alkaline Phosphatase: 73 U/L (ref 38–126)
Anion gap: 7 (ref 5–15)
BUN: 11 mg/dL (ref 6–20)
CO2: 27 mmol/L (ref 22–32)
Calcium: 8.6 mg/dL — ABNORMAL LOW (ref 8.9–10.3)
Chloride: 104 mmol/L (ref 98–111)
Creatinine, Ser: 0.71 mg/dL (ref 0.44–1.00)
GFR, Estimated: >60 mL/min (ref >60)
Glucose, Bld: 109 mg/dL — ABNORMAL HIGH (ref 70–99)
Potassium: 3.5 mmol/L (ref 3.5–5.1)
Sodium: 138 mmol/L (ref 135–145)
Total Bilirubin: 0.4 mg/dL (ref 0.3–1.2)
Total Protein: 6.9 g/dL (ref 6.5–8.1)

## 2022-06-20 LAB — CBC WITH DIFFERENTIAL/PLATELET
Abs Immature Granulocytes: 0.02 10*3/uL (ref 0.00–0.07)
Basophils Absolute: 0 10*3/uL (ref 0.0–0.1)
Basophils Relative: 1 %
Eosinophils Absolute: 0.2 10*3/uL (ref 0.0–0.5)
Eosinophils Relative: 3 %
HCT: 38 % (ref 36.0–46.0)
Hemoglobin: 12.6 g/dL (ref 12.0–15.0)
Immature Granulocytes: 0 %
Lymphocytes Relative: 38 %
Lymphs Abs: 3 10*3/uL (ref 0.7–4.0)
MCH: 30.6 pg (ref 26.0–34.0)
MCHC: 33.2 g/dL (ref 30.0–36.0)
MCV: 92.2 fL (ref 80.0–100.0)
Monocytes Absolute: 0.6 10*3/uL (ref 0.1–1.0)
Monocytes Relative: 7 %
Neutro Abs: 4.1 10*3/uL (ref 1.7–7.7)
Neutrophils Relative %: 51 %
Platelets: 261 10*3/uL (ref 150–400)
RBC: 4.12 MIL/uL (ref 3.87–5.11)
RDW: 13.7 % (ref 11.5–15.5)
WBC: 7.9 10*3/uL (ref 4.0–10.5)
nRBC: 0 % (ref 0.0–0.2)

## 2022-06-20 LAB — HCG, QUANTITATIVE, PREGNANCY: hCG, Beta Chain, Quant, S: <1 m[IU]/mL (ref <5)

## 2022-06-20 MED ORDER — DIPHENHYDRAMINE HCL 50 MG/ML IJ SOLN
12.5000 mg | Freq: Once | INTRAMUSCULAR | Status: AC
  Administered 2022-06-20: 12.5 mg via INTRAVENOUS
  Filled 2022-06-20: qty 1

## 2022-06-20 MED ORDER — KETOROLAC TROMETHAMINE 15 MG/ML IJ SOLN
15.0000 mg | Freq: Once | INTRAMUSCULAR | Status: AC
  Administered 2022-06-20: 15 mg via INTRAVENOUS
  Filled 2022-06-20: qty 1

## 2022-06-20 MED ORDER — PROCHLORPERAZINE EDISYLATE 10 MG/2ML IJ SOLN
10.0000 mg | Freq: Once | INTRAMUSCULAR | Status: AC
  Administered 2022-06-20: 10 mg via INTRAVENOUS
  Filled 2022-06-20: qty 2

## 2022-06-20 MED ORDER — SODIUM CHLORIDE 0.9 % IV BOLUS
1000.0000 mL | Freq: Once | INTRAVENOUS | Status: AC
  Administered 2022-06-20: 1000 mL via INTRAVENOUS

## 2022-06-20 NOTE — ED Provider Notes (Signed)
[facility] Provider Note   CSN: [id number] Arrival date & time: 06/20/22  1541     History Chief Complaint  Patient presents with   Headache    Emily Skinner is a 32 y.o. female.  Patient presents emerged part with complaints of a headache.  She reports this headache has been present for the last year or so.  She states that recently she had noted some slight vision changes with this but this may be confounding she does have a prior history of migraine headaches as well.  She does feel like this is maybe slightly different than migraine but reports that her symptoms have largely resolved at this point.  Current headache is a 4 out of 10.  Denies any prior cardiac history but does take metoprolol for known tachycardia.  Denies any history of any clots, strokes.  Not on blood thinners.  Denies any other symptoms at this time such as chest pain, shortness of breath, abdominal pain, nausea, vomiting, diarrhea, urinary symptoms.   Headache      Home Medications Prior to Admission medications   Medication Sig Start Date End Date Taking? Authorizing Provider  aspirin EC 81 MG tablet Take 81 mg by mouth daily. Swallow whole.    [provider]  hydrocortisone (ANUSOL-HC) 2.5 % rectal cream Place 1 application. rectally 2 (two) times daily.    [provider]  inFLIXimab in sodium chloride 0.9 % Inject into the vein.    [provider]  metoprolol tartrate (LOPRESSOR) 25 MG tablet Take 25 mg by mouth 2 (two) times daily.    [provider]  nystatin ointment (MYCOSTATIN) Apply 1 application. topically 2 (two) times daily.    [provider]  Prenatal Vit-Fe Fumarate-FA (PRENATAL MULTIVITAMIN) TABS tablet Take 1 tablet by mouth daily at 12 noon.    [provider]      Allergies    Sulfa antibiotics    Review of Systems   Review of Systems  Neurological:  Positive for headaches.  All  other systems reviewed and are negative.   Physical Exam Updated Vital Signs BP (!) 106/58 (BP Location: Left Arm)   Pulse 68   Temp 98.9 F (37.2 C) (Oral)   Resp 16   Wt 73.9 kg   LMP 06/13/2022 (Approximate)   SpO2 95%   BMI 25.53 kg/m  Physical Exam Vitals and nursing note reviewed.  Constitutional:      General: She is not in acute distress.    Appearance: She is well-developed.  HENT:     Head: Normocephalic and atraumatic.  Eyes:     Conjunctiva/sclera: Conjunctivae normal.  Cardiovascular:     Rate and Rhythm: Normal rate and regular rhythm.     Heart sounds: No murmur heard. Pulmonary:     Effort: Pulmonary effort is normal. No respiratory distress.     Breath sounds: Normal breath sounds.  Abdominal:     Palpations: Abdomen is soft.     Tenderness: There is no abdominal tenderness.  Musculoskeletal:        General: No swelling.     Cervical back: Neck supple.  Skin:    General: Skin is warm and dry.     Capillary Refill: Capillary refill takes less than 2 seconds.  Neurological:     Mental Status: She is alert.     Cranial Nerves: No cranial nerve deficit.     Comments: CN II-XII intact  Psychiatric:  Mood and Affect: Mood normal.     ED Results / Procedures / Treatments   Labs (all labs ordered are listed, but only abnormal results are displayed) Labs Reviewed  COMPREHENSIVE METABOLIC PANEL - Abnormal; Notable for the following components:      Result Value   Glucose, Bld 109 (*)    Calcium 8.6 (*)    All other components within normal limits  CBC WITH DIFFERENTIAL/PLATELET  HCG, QUANTITATIVE, PREGNANCY    EKG None  Radiology CT Cervical Spine Wo Contrast  Result Date: 06/20/2022 CLINICAL DATA:  Cervical radiculopathy and throbbing occipital headache. EXAM: CT CERVICAL SPINE WITHOUT CONTRAST TECHNIQUE: Multidetector CT imaging of the cervical spine was performed without intravenous contrast. Multiplanar CT image reconstructions were  also generated. RADIATION DOSE REDUCTION: This exam was performed according to the departmental dose-optimization program which includes automated exposure control, adjustment of the mA and/or kV according to patient size and/or use of iterative reconstruction technique. COMPARISON:  None Available. FINDINGS: Alignment: No evidence of traumatic malalignment. Loss of lordosis is likely chronic/positional. Skull base and vertebrae: No acute fracture. No primary bone lesion or focal pathologic process. Soft tissues and spinal canal: No prevertebral fluid or swelling. No visible canal hematoma. Disc levels: Mild multilevel spondylosis. Disc space height is maintained. No significant spinal canal or neural foraminal narrowing. Upper chest: Emphysema. Other: None. IMPRESSION: No acute fracture in the cervical spine. Emphysema (ICD10-J43.9). Electronically Signed   By: Minerva Fester M.D.   On: 06/20/2022 20:12   CT Head Wo Contrast  Result Date: 06/20/2022 CLINICAL DATA:  Headache, neuro deficit EXAM: CT HEAD WITHOUT CONTRAST TECHNIQUE: Contiguous axial images were obtained from the base of the skull through the vertex without intravenous contrast. RADIATION DOSE REDUCTION: This exam was performed according to the departmental dose-optimization program which includes automated exposure control, adjustment of the mA and/or kV according to patient size and/or use of iterative reconstruction technique. COMPARISON:  None Available. FINDINGS: Brain: No acute intracranial abnormality. Specifically, no hemorrhage, hydrocephalus, mass lesion, acute infarction, or significant intracranial injury. Vascular: No hyperdense vessel or unexpected calcification. Skull: No acute calvarial abnormality. Sinuses/Orbits: No acute findings Other: None IMPRESSION: Normal study. Electronically Signed   By: Charlett Nose M.D.   On: 06/20/2022 18:27    Procedures Procedures   Medications Ordered in ED Medications  sodium chloride 0.9 %  bolus 1,000 mL (1,000 mLs Intravenous New Bag/Given 06/20/22 1950)  prochlorperazine (COMPAZINE) injection 10 mg (10 mg Intravenous Given 06/20/22 1951)  diphenhydrAMINE (BENADRYL) injection 12.5 mg (12.5 mg Intravenous Given 06/20/22 1951)  ketorolac (TORADOL) 15 MG/ML injection 15 mg (15 mg Intravenous Given 06/20/22 1951)    ED Course/ Medical Decision Making/ A&P                           Medical Decision Making Amount and/or Complexity of Data Reviewed Labs: ordered. Radiology: ordered.  Risk Prescription drug management.   This patient presents to the ED for concern of headache.  Differential diagnosis includes chronic headache, migraine, tension headache, meningitis   Lab Tests:  I Ordered, and personally interpreted labs.  The pertinent results include: Unremarkable CBC, CMP only notable for mild hyperglycemia, beta-hCG negative   Imaging Studies ordered:  I ordered imaging studies including CT head, CT cervical spine I independently visualized and interpreted imaging which showed intracranial abnormalities, no abnormalities noted in cervical spine I agree with the radiologist interpretation   Medicines ordered and prescription drug management:  I ordered medication including fluids, Compazine, Toradol, Benadryl for migraine Reevaluation of the patient after these medicines showed that the patient improved I have reviewed the patients home medicines and have made adjustments as needed   Problem List / ED Course:  Patient presented to the emergency department complaints of headache.  She reports that she has had somewhat persistent headaches off and on for the last year or so.  She does report this current headache is again some radiation going down towards her neck and reported that today she felt somewhat lightheaded with some mild vision changes that briefly persisted and then went away.  She is not currently Dors any nausea.  Based on presentation, lab work initiated  without any acute abnormalities noted on labs.  Imaging was also ordered with a CT head that was reassuring followed by a CT cervical spine for evaluation of radiating neck symptoms which also was reassuring.  Migraine cocktail was initiated with fluids, Compazine, Toradol, Benadryl which patient reports provided significant relief in her symptoms.  Given this relief with migraine cocktail, I believe the patient was most likely experiencing a complex migraine also but atypical from her typical migraines.  Advised patient to follow-up primary care provider for further evaluation.  Also provided patient with contact information for [facility] neurology for further evaluation of her migraines are unrelenting and return.  Patient is agreeable with treatment plan verbalized understanding all return precautions.  All questions answered prior to patient discharge.   Final Clinical Impression(s) / ED Diagnoses Final diagnoses:  Migraine without status migrainosus, not intractable, unspecified migraine type    Rx / DC Orders ED Discharge Orders     None         Salomon Mast 06/20/22 2219    Maia Plan, MD 06/21/22 [phone]

## 2022-06-20 NOTE — Discharge Instructions (Addendum)
You were seen in the emergency department for a headache. After labs and imaging, there does not appear to be an acute cause behind your headaches and I believe that it may be due to a migraine. Since you had significant relief in your symptoms, I believe that it is highly likely that you were experiencing a migraine. Please follow up with your primary care doctor for further evaluation. You had very minor hyperglycemia during your visit today so ensure that you are up to date on your diabetic screening.   If you feel that your symptoms worsen, please return to the ED for further evaluation.  I have also provided you with information for [facility] neurology which you can follow-up with if needed if your headaches persist or not improving.

## 2022-06-20 NOTE — ED Triage Notes (Signed)
Occipital throbbing headache x 1 year . Yesterday sharp pain going down to neck . Lightheaded and vision changes today . Intermittent waves she said . Denies nausea .  Alert and oriented x4 . Hx migraine yet not feeling the same .

## 2023-05-31 IMAGING — US US OB COMP LESS 14 WK
1 series · 14 of 26 positions shown · non-contrast
Comparison: None.

CLINICAL DATA: Vaginal bleeding for 1 day. First trimester
pregnancy.

EXAM:
OBSTETRIC <14 WK US AND TRANSVAGINAL OB US
TECHNIQUE: Both transabdominal and transvaginal ultrasound examinations were
performed for complete evaluation of the gestation as well as the
maternal uterus, adnexal regions, and pelvic cul-de-sac.
Transvaginal technique was performed to assess early pregnancy.

[Series 1: us ob comp less 14 wk · 26 acquisitions, 14 frames shown]
[im 1/26]
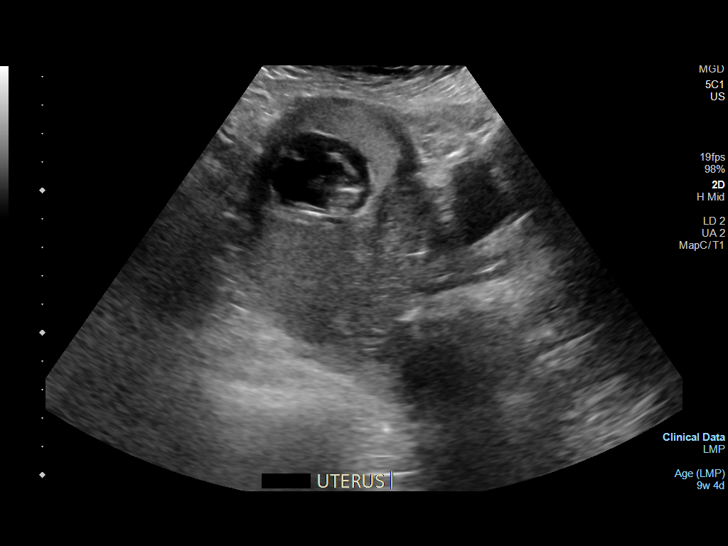
[im 3/26]
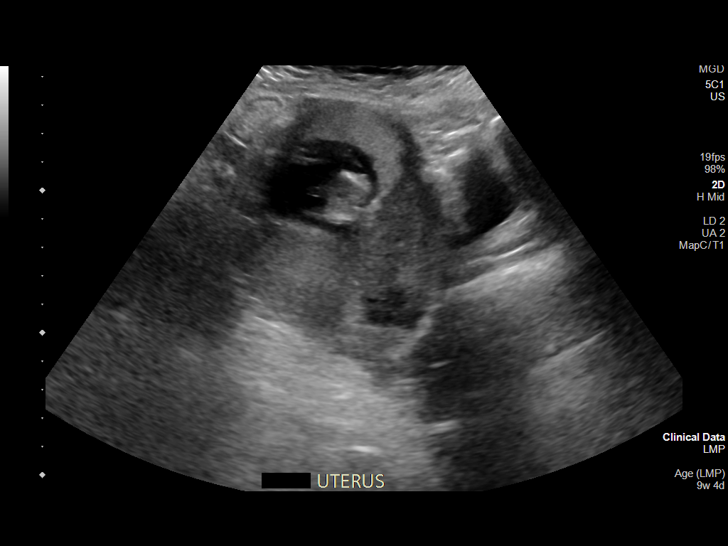
[im 5/26]
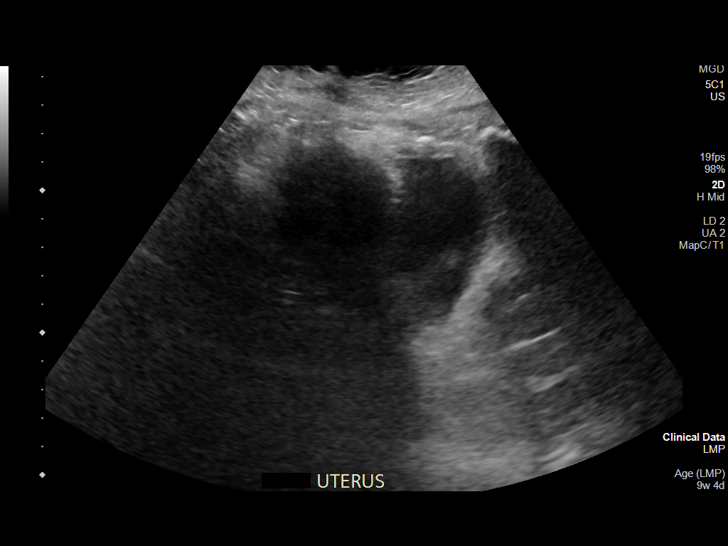
[im 7/26]
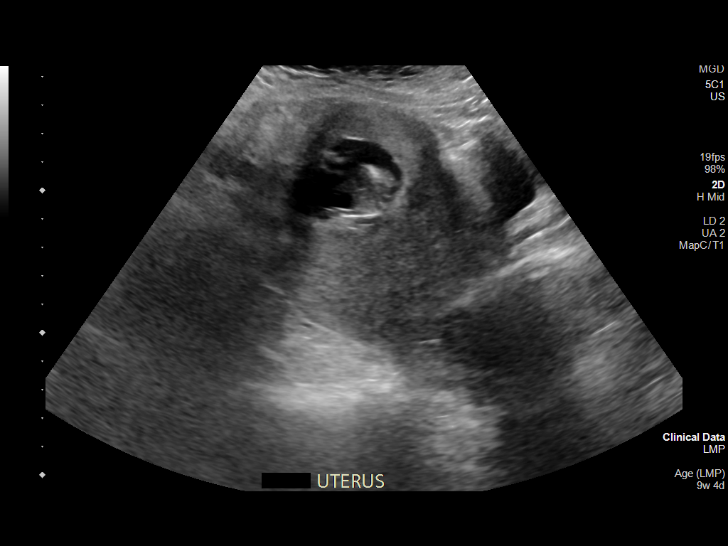
[im 9/26]
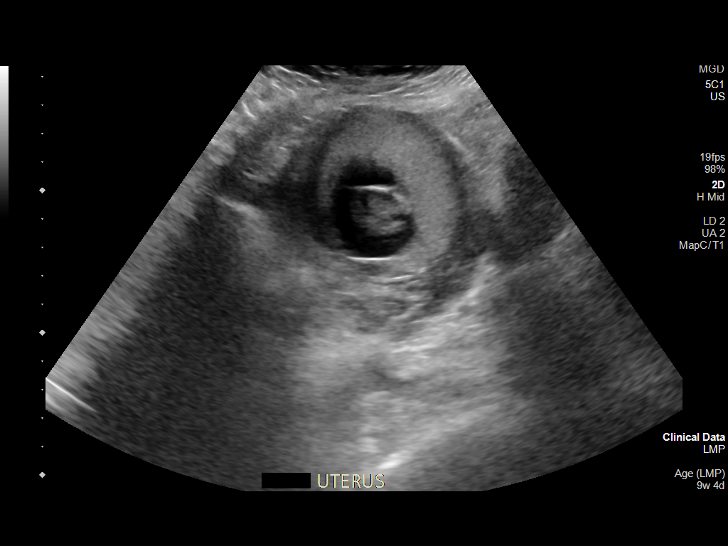
[im 11/26]
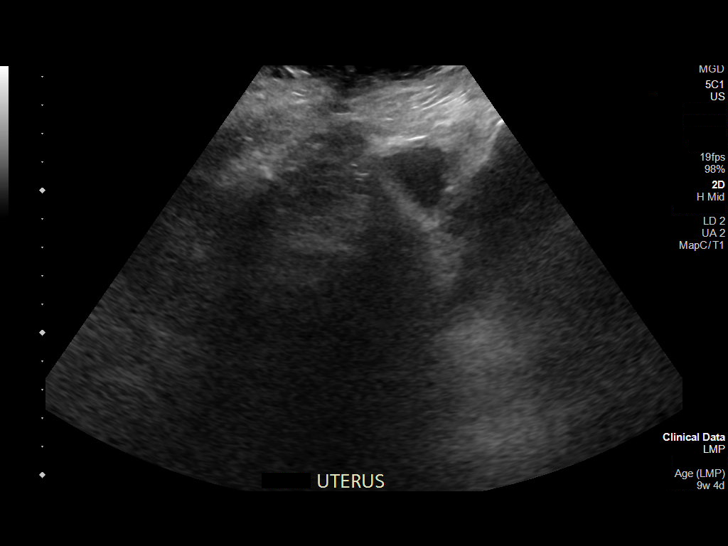
[im 13/26]
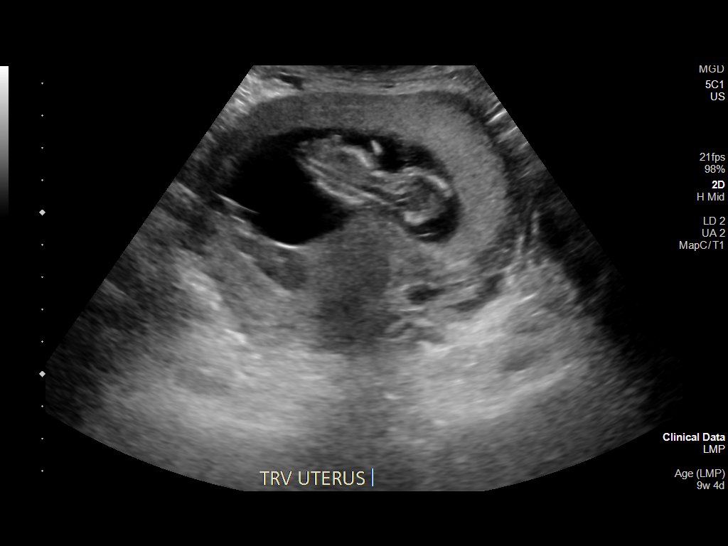
[im 14/26]
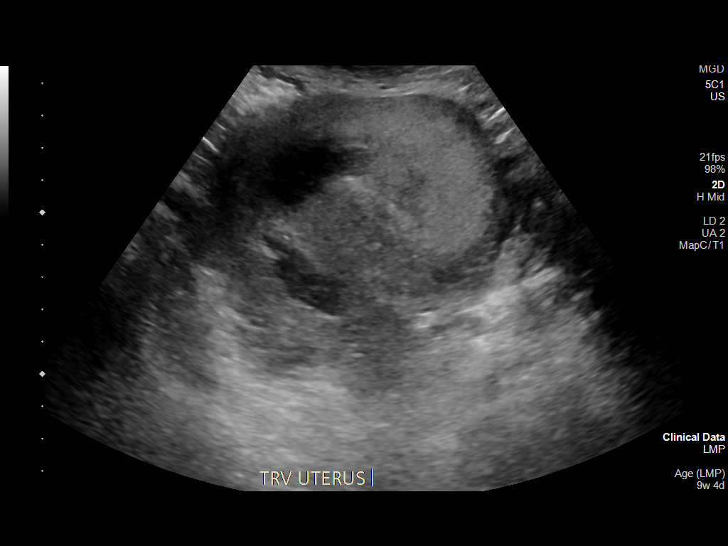
[im 16/26]
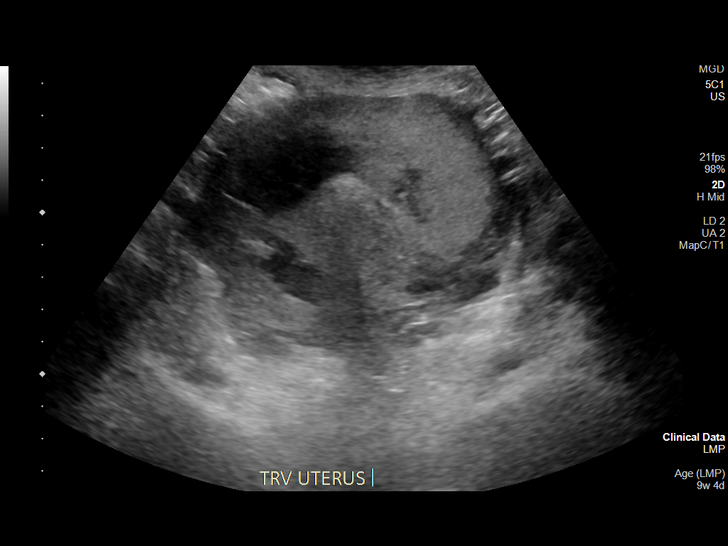
[im 18/26]
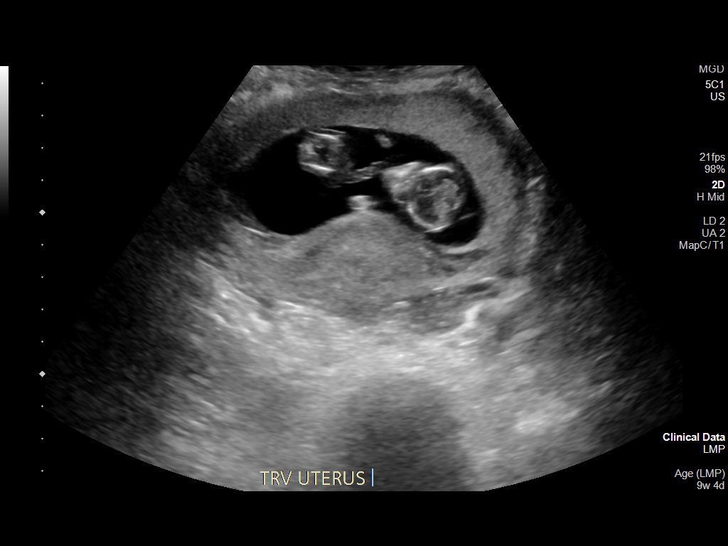
[im 20/26]
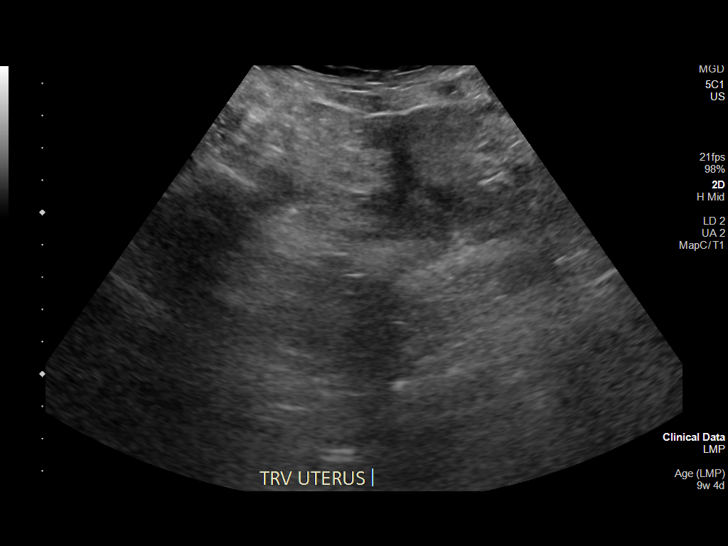
[im 22/26]
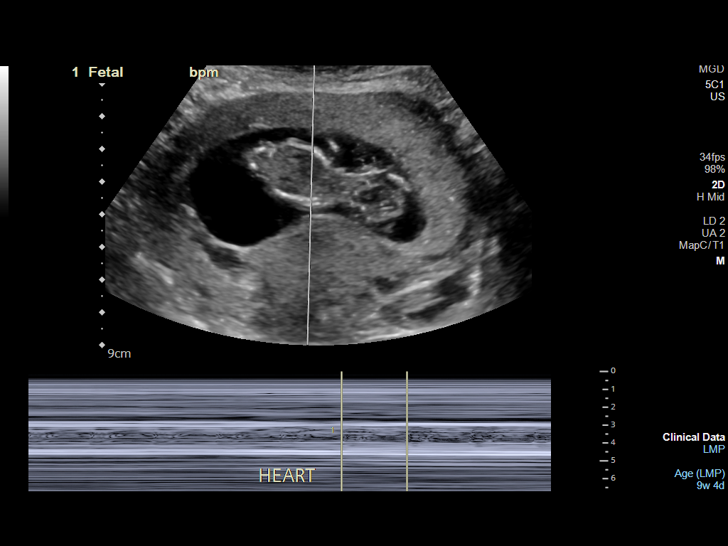
[im 24/26]
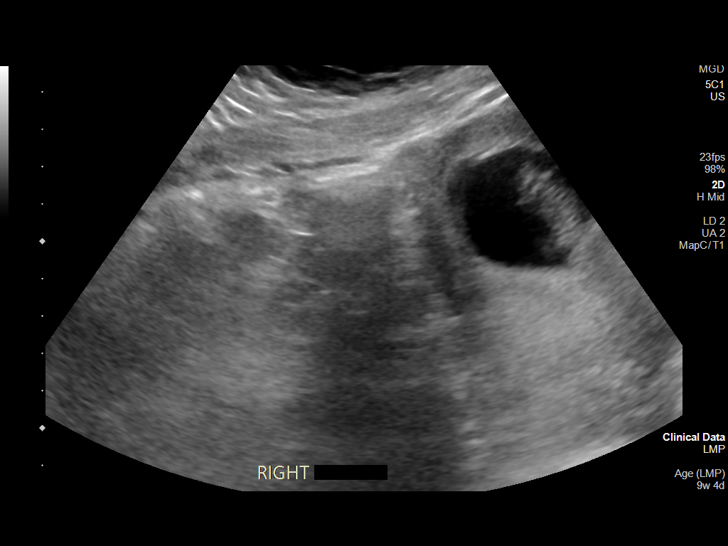
[im 26/26]
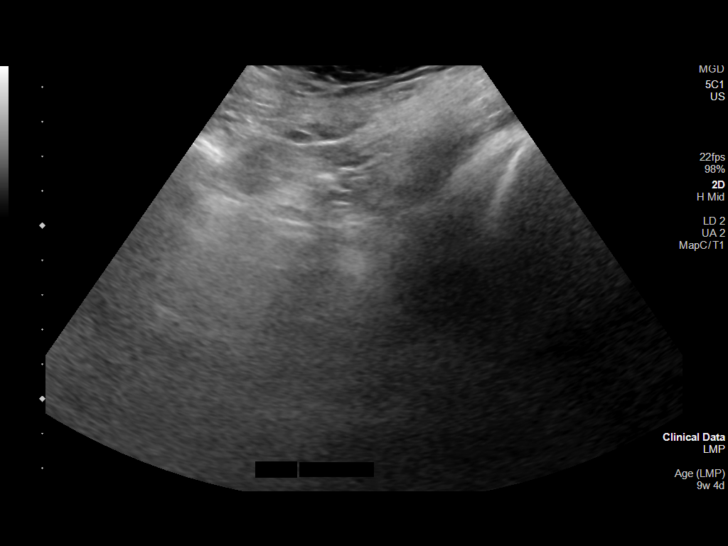

[14 of 26 positions shown; findings below may reference images not displayed]

FINDINGS: Intrauterine gestational sac: Present

Yolk sac:  Absent

Embryo:  Present

Cardiac Activity: Present

Heart Rate: 160 bpm

CRL:  52 mm   11 w   6 d                  US EDC: 10/16/2021

Subchorionic hemorrhage:  Moderate, measuring 2.2 by 1.8 by 3.3 cm.

Maternal uterus/adnexae: No free fluid. The ovaries were not well
seen.
IMPRESSION: 1. Single living intrauterine pregnancy measuring at 11 weeks, 6
days gestation. There is a moderate amount of subchorionic
hemorrhage measuring 2.2 by 1.8 by 3.3 cm.
2. The maternal ovaries were not well seen.

## 2023-05-31 IMAGING — US US OB TRANSVAGINAL
1 series · 14 of 28 positions shown · non-contrast
Comparison: None.

CLINICAL DATA: Vaginal bleeding for 1 day. First trimester
pregnancy.

EXAM:
OBSTETRIC <14 WK US AND TRANSVAGINAL OB US
TECHNIQUE: Both transabdominal and transvaginal ultrasound examinations were
performed for complete evaluation of the gestation as well as the
maternal uterus, adnexal regions, and pelvic cul-de-sac.
Transvaginal technique was performed to assess early pregnancy.

[Series 1: us ob transvaginal · 29 acquisitions, 14 frames shown]
[im 2/29]
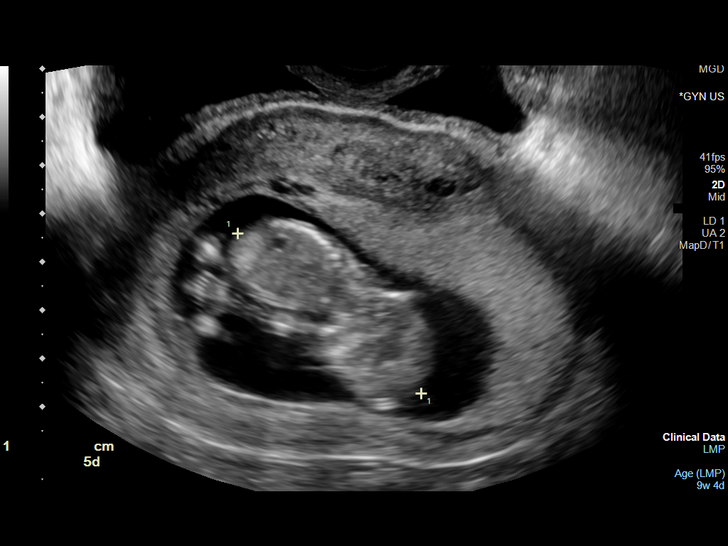
[im 4/29]
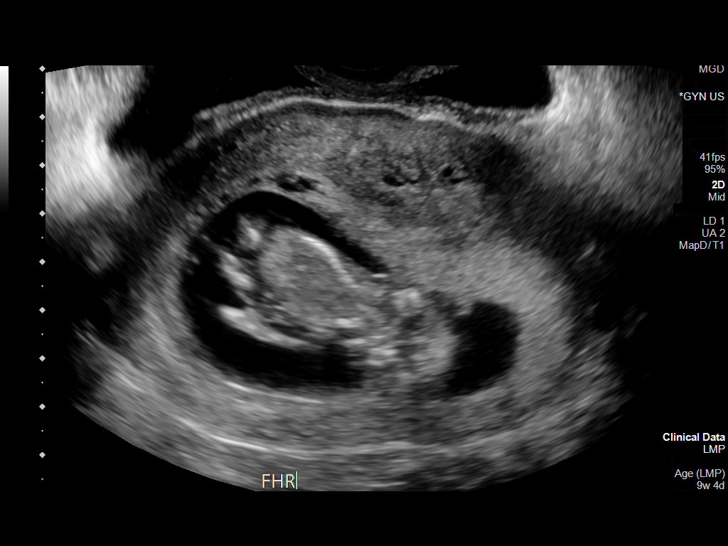
[im 6/29]
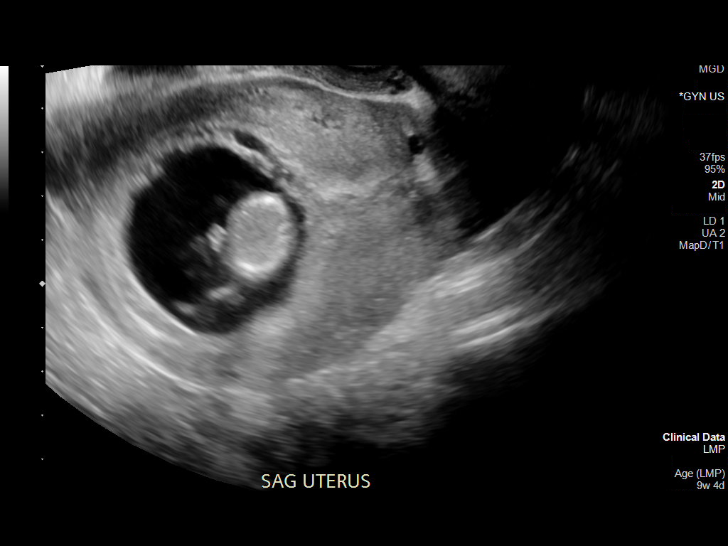
[im 8/29]
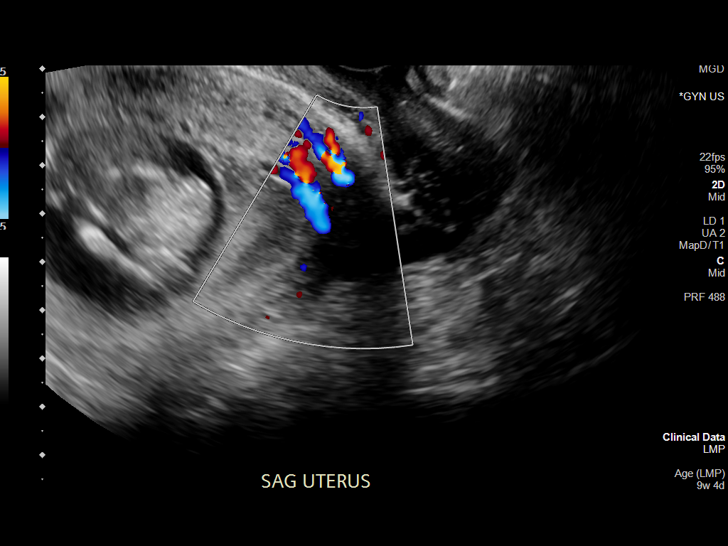
[im 10/29]
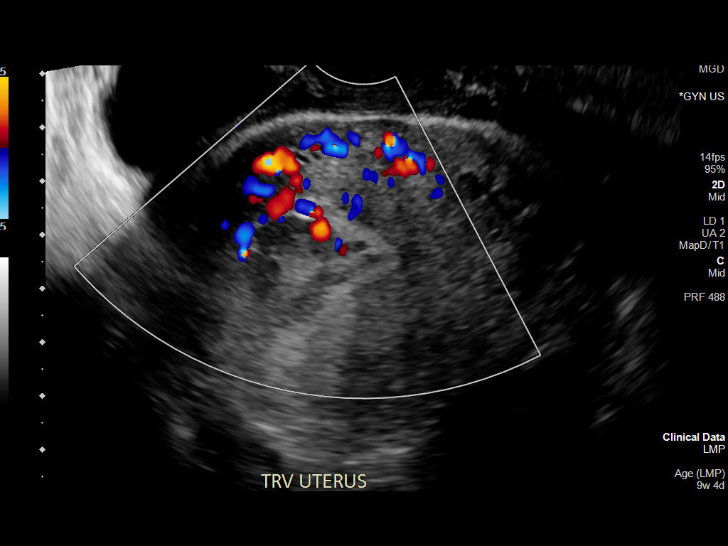
[im 12/29]
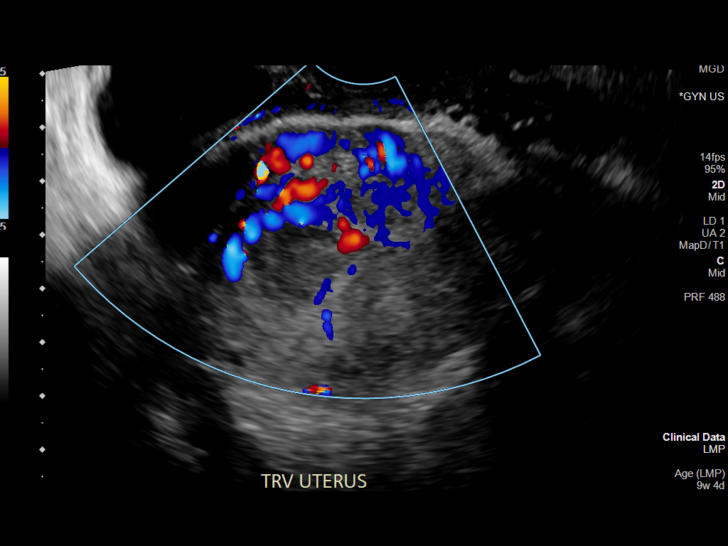
[im 14/29]
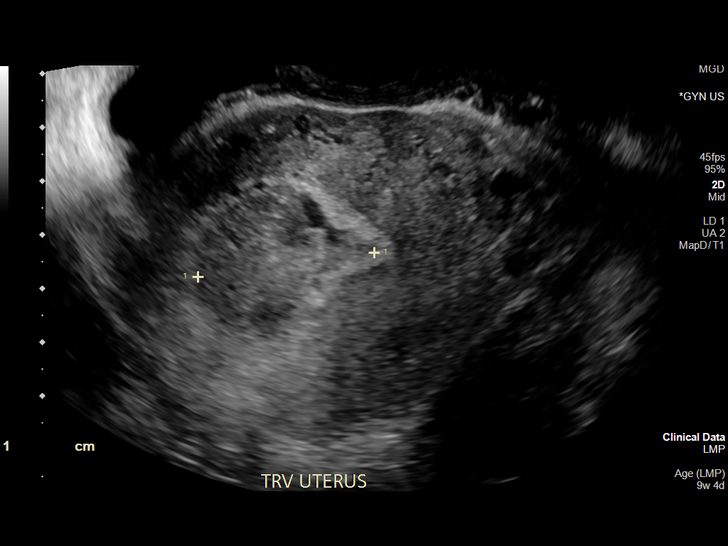
[im 16/29]
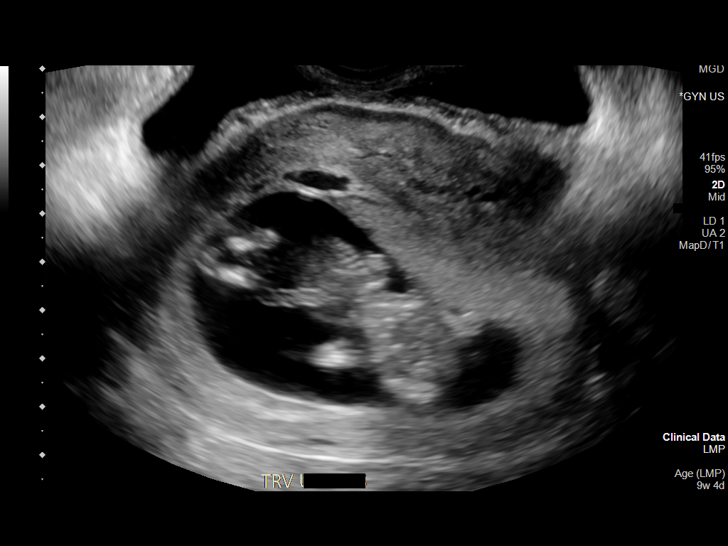
[im 18/29]
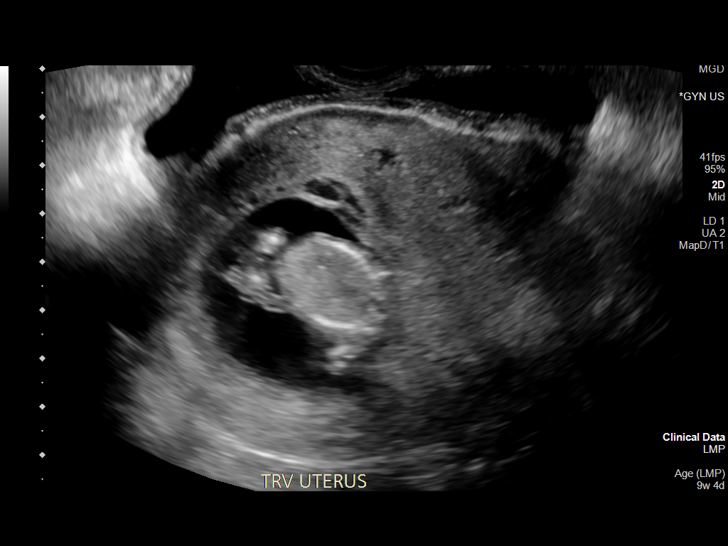
[im 20/29]
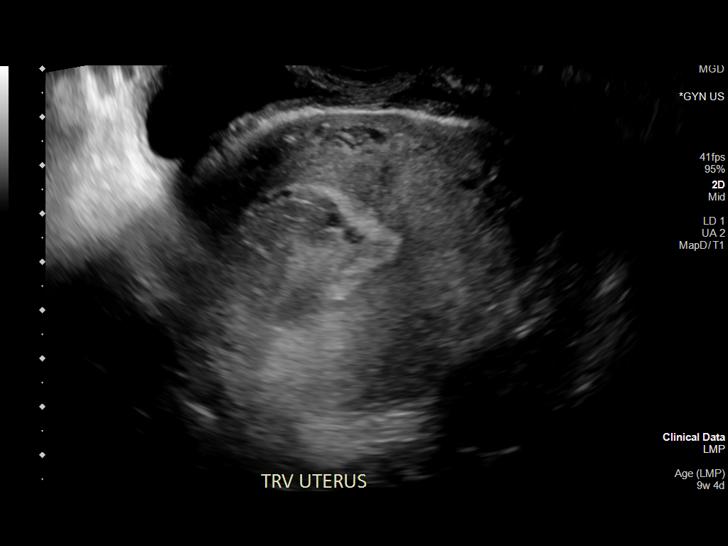
[im 22/29]
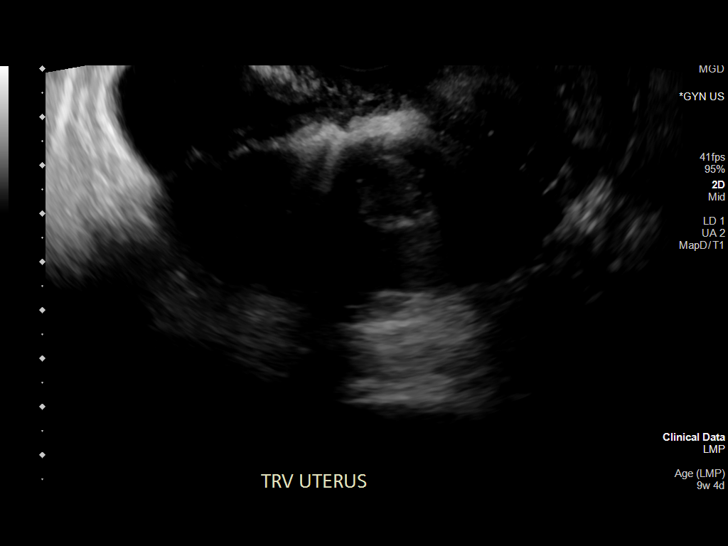
[im 24/29]
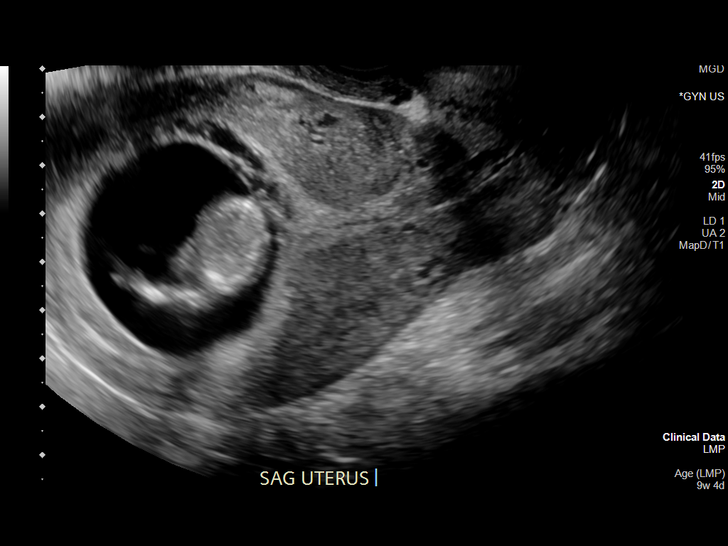
[im 26/29]
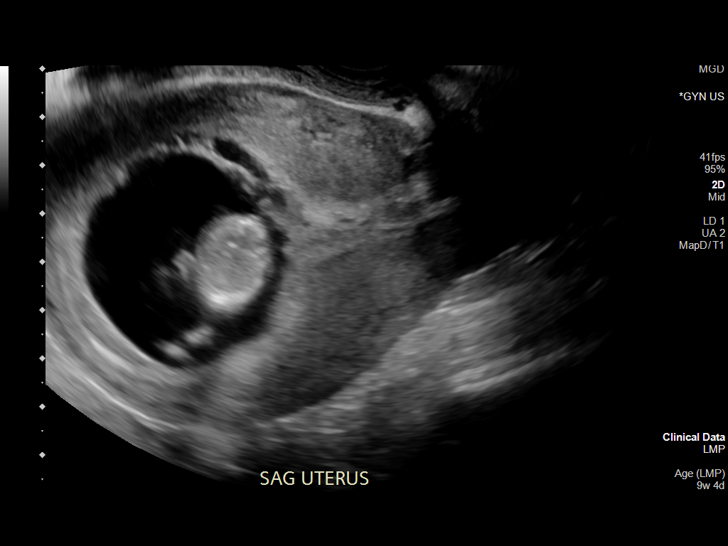
[im 29/29]
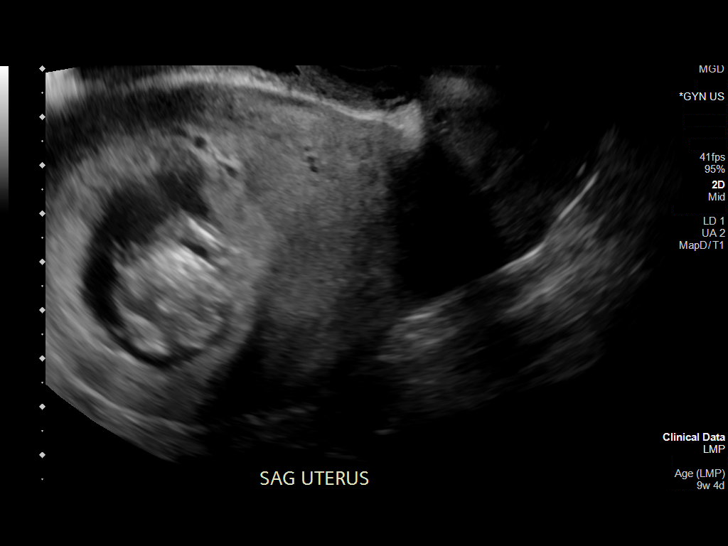

[14 of 28 positions shown; findings below may reference images not displayed]

FINDINGS: Intrauterine gestational sac: Present

Yolk sac:  Absent

Embryo:  Present

Cardiac Activity: Present

Heart Rate: 160 bpm

CRL:  52 mm   11 w   6 d                  US EDC: 10/16/2021

Subchorionic hemorrhage:  Moderate, measuring 2.2 by 1.8 by 3.3 cm.

Maternal uterus/adnexae: No free fluid. The ovaries were not well
seen.
IMPRESSION: 1. Single living intrauterine pregnancy measuring at 11 weeks, 6
days gestation. There is a moderate amount of subchorionic
hemorrhage measuring 2.2 by 1.8 by 3.3 cm.
2. The maternal ovaries were not well seen.
